# Patient Record
Sex: Female | Born: 1990 | Race: White | Hispanic: No | Marital: Married | Smoking: Former smoker
Health system: Southern US, Community
[De-identification: ages and names within clinical notes are randomized; demographics above are authoritative.]

## PROBLEM LIST (undated history)

## (undated) ENCOUNTER — Inpatient Hospital Stay (HOSPITAL_COMMUNITY): Payer: Self-pay

## (undated) DIAGNOSIS — T8859XA Other complications of anesthesia, initial encounter: Secondary | ICD-10-CM

## (undated) DIAGNOSIS — IMO0002 Reserved for concepts with insufficient information to code with codable children: Secondary | ICD-10-CM

## (undated) DIAGNOSIS — J45909 Unspecified asthma, uncomplicated: Secondary | ICD-10-CM

## (undated) DIAGNOSIS — T4145XA Adverse effect of unspecified anesthetic, initial encounter: Secondary | ICD-10-CM

## (undated) HISTORY — PX: WISDOM TOOTH EXTRACTION: SHX21

## (undated) HISTORY — PX: TONSILLECTOMY: SUR1361

## (undated) HISTORY — PX: CERVICAL BIOPSY: SHX590

---

## 2011-12-29 ENCOUNTER — Emergency Department (HOSPITAL_COMMUNITY)
Admission: EM | Admit: 2011-12-29 | Discharge: 2011-12-29 | Disposition: A | Discharge status: Home / Self Care | Payer: BC Managed Care – PPO | Attending: Emergency Medicine | Admitting: Emergency Medicine

## 2011-12-29 ENCOUNTER — Encounter: Payer: Self-pay | Admitting: *Deleted

## 2011-12-29 DIAGNOSIS — R002 Palpitations: Secondary | ICD-10-CM | POA: Insufficient documentation

## 2011-12-29 DIAGNOSIS — R0789 Other chest pain: Secondary | ICD-10-CM | POA: Insufficient documentation

## 2011-12-29 DIAGNOSIS — R1310 Dysphagia, unspecified: Secondary | ICD-10-CM | POA: Insufficient documentation

## 2011-12-29 DIAGNOSIS — R0602 Shortness of breath: Secondary | ICD-10-CM | POA: Insufficient documentation

## 2011-12-29 DIAGNOSIS — R42 Dizziness and giddiness: Secondary | ICD-10-CM | POA: Insufficient documentation

## 2011-12-29 DIAGNOSIS — R000 Tachycardia, unspecified: Secondary | ICD-10-CM | POA: Insufficient documentation

## 2011-12-29 DIAGNOSIS — T783XXA Angioneurotic edema, initial encounter: Secondary | ICD-10-CM

## 2011-12-29 DIAGNOSIS — Y9229 Other specified public building as the place of occurrence of the external cause: Secondary | ICD-10-CM | POA: Insufficient documentation

## 2011-12-29 DIAGNOSIS — X58XXXA Exposure to other specified factors, initial encounter: Secondary | ICD-10-CM | POA: Insufficient documentation

## 2011-12-29 MED ORDER — EPINEPHRINE 0.3 MG/0.3ML IJ DEVI: 0.3000 mg | Freq: Once | INTRAMUSCULAR | Status: AC

## 2011-12-29 MED ORDER — FAMOTIDINE IN NACL 20-0.9 MG/50ML-% IV SOLN
20.0000 mg | Freq: Once | INTRAVENOUS | Status: AC
  Administered 2011-12-29: 20 mg via INTRAVENOUS
  Filled 2011-12-29: qty 50

## 2011-12-29 MED ORDER — DIPHENHYDRAMINE HCL 50 MG/ML IJ SOLN
INTRAMUSCULAR | Status: AC
  Filled 2011-12-29: qty 1

## 2011-12-29 MED ORDER — FAMOTIDINE 40 MG PO TABS: 20.0000 mg | ORAL_TABLET | Freq: Two times a day (BID) | ORAL | Status: DC

## 2011-12-29 MED ORDER — METHYLPREDNISOLONE SODIUM SUCC 125 MG IJ SOLR
125.0000 mg | Freq: Once | INTRAMUSCULAR | Status: AC
  Administered 2011-12-29: 125 mg via INTRAVENOUS
  Filled 2011-12-29: qty 2

## 2011-12-29 MED ORDER — PREDNISONE 20 MG PO TABS: 60.0000 mg | ORAL_TABLET | Freq: Every day | ORAL | Status: DC

## 2011-12-29 MED ORDER — DIPHENHYDRAMINE HCL 25 MG PO TABS: 25.0000 mg | ORAL_TABLET | Freq: Four times a day (QID) | ORAL | Status: AC

## 2011-12-29 NOTE — ED Notes (Signed)
Per EMS- pt in s/p allergic reaction after eating dinner, swelling to tongue and throat, given 50mg  benadryl (25mg  PO and 25mg  IV), 20g in LFA, swelling reduced, pt states she feels better at this time

## 2011-12-29 NOTE — ED Notes (Signed)
WUJ:[id number]<JY> Expected date:12/29/11<BR> Expected time: 6:37 PM<BR> Means of arrival:Ambulance<BR> Comments:<BR> M110 - 20yoF ?Allergic reaction to shellfish.

## 2011-12-29 NOTE — ED Provider Notes (Addendum)
Medical screening examination/treatment/procedure(s) were performed by non-physician practitioner and as supervising physician I was immediately available for consultation/collaboration.  Per PA all sx resolved at this time. Patient does not wish to stay for extended observation which I would prefer-- she is going home with parents who will follow closely, given precautions by PA for return.   Forbes Cellar, MD 12/29/11 1610  Forbes Cellar, MD 12/29/11 2053

## 2011-12-29 NOTE — ED Provider Notes (Signed)
History     CSN: [id number]  Arrival date & time 12/29/11  1842   First MD Initiated Contact with Patient 12/29/11 1847      Chief Complaint  Patient presents with  . Allergic Reaction    (Consider location/radiation/quality/duration/timing/severity/associated sxs/prior treatment) Patient is a 21 y.o. female presenting with allergic reaction. The history is provided by the patient.  Allergic Reaction The primary symptoms are  shortness of breath, dizziness, palpitations and angioedema. The primary symptoms do not include nausea, vomiting, rash or urticaria. The current episode started less than 1 hour ago. The problem has been resolved.  Dizziness does not occur with nausea or vomiting.  The palpitations also occurred with dizziness and shortness of breath.   The angioedema is associated with shortness of breath.   The onset of the reaction was associated with eating.   Pt states she was a red lobster. States had shrimp for dinner, few minutes later, after finishing her dinner, she developed tightness in her chest, through, states felt like her tongue swelled up, and she had difficulty breathing. Pt was given benadryl by EMS, states her symptoms almost completely resolved. Denies previous allergy to fish. States once ate crab meat and it "made me feel funny."  No rash, no other complaints.    No past medical history on file.  No past surgical history on file.  No family history on file.  History  Substance Use Topics  . Smoking status: Not on file  . Smokeless tobacco: Not on file  . Alcohol Use: Not on file    OB History    Grav Para Term Preterm Abortions TAB SAB Ect Mult Living                  Review of Systems  Constitutional: Negative for fever and chills.  HENT: Positive for trouble swallowing. Negative for facial swelling and neck stiffness.        Throat swelling, tongue swelling  Respiratory: Positive for chest tightness and shortness of breath.     Cardiovascular: Positive for palpitations.  Gastrointestinal: Negative for nausea and vomiting.  Genitourinary: Negative.   Musculoskeletal: Negative.   Skin: Negative for rash.  Neurological: Positive for dizziness.  Psychiatric/Behavioral: Negative.     Allergies  Amoxicillin  Home Medications   Current Outpatient Rx  Name Route Sig Dispense Refill  . LOESTRIN 24 FE PO Oral Take 1 tablet by mouth daily.        BP 121/68  Pulse 107  Temp(Src) 98 F (36.7 C) (Oral)  SpO2 100%  Physical Exam  Nursing note and vitals reviewed. Constitutional: She is oriented to person, place, and time. She appears well-developed and well-nourished. No distress.  HENT:  Head: Normocephalic and atraumatic.  Right Ear: External ear normal.  Left Ear: External ear normal.  Nose: Nose normal.  Mouth/Throat: Oropharynx is clear and moist.       Tongue normal size, uvula non edematous, midline  Eyes: Conjunctivae are normal.  Neck: Neck supple.  Cardiovascular: Regular rhythm and normal heart sounds.  Tachycardia present.   Pulmonary/Chest: Effort normal and breath sounds normal. No respiratory distress.       No wheezes, no stridor  Abdominal: Soft. Bowel sounds are normal. There is no tenderness.  Neurological: She is alert and oriented to person, place, and time.  Skin: Skin is warm and dry. No rash noted.  Psychiatric: She has a normal mood and affect.    ED Course  Procedures (including critical  care time)  Pt with tongue swelling, difficulty breathing after eating shrimp earlier today. By the time pt arrived here, she is symptom free. No stridor, no swelling of tongue, lips, uvula. Tachycardic, otherwise normal VS. Will administer steroids, pepcid, will monitor.   8:28 PM Pt monitored almost 2 hrs in ED. She continues to be symptom free. Pt wanting to go home. Pt will be d/c home with parents. Advised to keep a close eye on pt to watch for 2ndary reaction once medications wear out.  Pt will be d/c home with steroids, benadryl, pepcid and epi pen.    MDM          Lottie Mussel, PA 12/29/11 2029

## 2013-08-11 ENCOUNTER — Inpatient Hospital Stay (HOSPITAL_COMMUNITY): Payer: BC Managed Care – PPO

## 2013-08-11 ENCOUNTER — Encounter (HOSPITAL_COMMUNITY): Payer: Self-pay | Admitting: *Deleted

## 2013-08-11 ENCOUNTER — Inpatient Hospital Stay (HOSPITAL_COMMUNITY)
Admission: AD | Admit: 2013-08-11 | Discharge: 2013-08-11 | Disposition: A | Discharge status: Home / Self Care | Payer: BC Managed Care – PPO | Source: Ambulatory Visit | Attending: Obstetrics & Gynecology | Admitting: Obstetrics & Gynecology

## 2013-08-11 DIAGNOSIS — M549 Dorsalgia, unspecified: Secondary | ICD-10-CM | POA: Insufficient documentation

## 2013-08-11 DIAGNOSIS — O26859 Spotting complicating pregnancy, unspecified trimester: Secondary | ICD-10-CM | POA: Insufficient documentation

## 2013-08-11 DIAGNOSIS — R209 Unspecified disturbances of skin sensation: Secondary | ICD-10-CM | POA: Insufficient documentation

## 2013-08-11 DIAGNOSIS — Z3490 Encounter for supervision of normal pregnancy, unspecified, unspecified trimester: Secondary | ICD-10-CM

## 2013-08-11 DIAGNOSIS — O99891 Other specified diseases and conditions complicating pregnancy: Secondary | ICD-10-CM | POA: Insufficient documentation

## 2013-08-11 DIAGNOSIS — O209 Hemorrhage in early pregnancy, unspecified: Secondary | ICD-10-CM

## 2013-08-11 HISTORY — DX: Other complications of anesthesia, initial encounter: T88.59XA

## 2013-08-11 HISTORY — DX: Reserved for concepts with insufficient information to code with codable children: IMO0002

## 2013-08-11 HISTORY — DX: Adverse effect of unspecified anesthetic, initial encounter: T41.45XA

## 2013-08-11 HISTORY — DX: Unspecified asthma, uncomplicated: J45.909

## 2013-08-11 LAB — ABO/RH: ABO/RH(D): O POS

## 2013-08-11 LAB — CBC WITH DIFFERENTIAL/PLATELET
Basophils Absolute: 0 10*3/uL (ref 0.0–0.1)
Lymphocytes Relative: 20 % (ref 12–46)
Lymphs Abs: 2.1 10*3/uL (ref 0.7–4.0)
Neutro Abs: 7.5 10*3/uL (ref 1.7–7.7)
Neutrophils Relative %: 70 % (ref 43–77)
Platelets: 397 10*3/uL (ref 150–400)
RBC: 4.43 MIL/uL (ref 3.87–5.11)
RDW: 13.2 % (ref 11.5–15.5)
WBC: 10.7 10*3/uL — ABNORMAL HIGH (ref 4.0–10.5)

## 2013-08-11 LAB — URINALYSIS, ROUTINE W REFLEX MICROSCOPIC
Bilirubin Urine: NEGATIVE
Leukocytes, UA: NEGATIVE
Nitrite: NEGATIVE
Specific Gravity, Urine: 1.02 (ref 1.005–1.030)
pH: 7 (ref 5.0–8.0)

## 2013-08-11 LAB — WET PREP, GENITAL
Clue Cells Wet Prep HPF POC: NONE SEEN
Trich, Wet Prep: NONE SEEN

## 2013-08-11 LAB — HCG, QUANTITATIVE, PREGNANCY: hCG, Beta Chain, Quant, S: 13952 m[IU]/mL — ABNORMAL HIGH (ref <5)

## 2013-08-11 LAB — POCT PREGNANCY, URINE: Preg Test, Ur: POSITIVE — AB

## 2013-08-11 NOTE — MAU Note (Signed)
Patient states she has had a positive home pregnancy test and at an Urgent Care in [place]. States she has had pins and needles feeling in the back of both lower legs, worse in the left. Has had mid to low back pain but not at the same time as the leg pain. States she has had pink spotting on and off for about one with with some abdominal cramping. Has nausea, no vomiting.

## 2013-08-11 NOTE — MAU Provider Note (Signed)
Attestation of Attending Supervision of Advanced Practitioner (CNM/NP): Evaluation and management procedures were performed by the Advanced Practitioner under my supervision and collaboration.  I have reviewed the Advanced Practitioner's note and chart, and I agree with the management and plan.  HARRAWAY-SMITH, Azaria Bartell 7:31 PM

## 2013-08-11 NOTE — MAU Provider Note (Signed)
History     CSN: [id number]  Arrival date and time: 08/11/13 1357   First Provider Initiated Contact with Patient 08/11/13 1501    Chief complaint  "I've had some spotting, and my back has been hurting."   HPI  Lindsay Rollins is a 22 y.o. G2P0010 who presents today due to back pain, changes in neurological sensation in her legs, and vaginal bleeding. She reports being roughly six weeks pregnant. The pregnancy was unplanned.  She has a history of irregular menstrual periods.  Her primary concern today is the baby's health.  Her vaginal bleeding is described as spotting that occurs without insult.  She reports the amount of blood as scant, and is seen only during after using the bathroom.  The spotting has occurred three times, once last week, yesterday and today.  She denies abdominal pain.  She does report some frequency and urgency with urination, but denies dysuria. She is defecating normally.    Her back pain began less than three weeks ago.  She describes it as a dull ache, bilateral, and has pain with bending over. She reports feeling tingling bilaterally in her feet periodically.  She denies back injury or trauma.  She is in a 1 year monogamous relationship with the father of the fetus.  She denies vaginal itching, discomfort, or pain.           Past Medical History  Diagnosis Date  . Asthma   . Ulcer   . Abnormal Pap smear   . Complication of anesthesia     Past Surgical History  Procedure Laterality Date  . Cervical biopsy    . Tonsillectomy      No family history on file.  History  Substance Use Topics  . Smoking status: Former Smoker    Types: Cigarettes    Quit date: 07/17/2013  . Smokeless tobacco: Not on file  . Alcohol Use: No    Allergies:  Allergies  Allergen Reactions  . Shellfish Allergy Anaphylaxis  . Amoxicillin Hives  . Penicillins Hives  . Clindamycin/Lincomycin Palpitations    Prescriptions prior to admission  Medication Sig Dispense  Refill  . albuterol (PROVENTIL HFA;VENTOLIN HFA) 108 (90 BASE) MCG/ACT inhaler Inhale 2 puffs into the lungs every 6 (six) hours as needed for wheezing or shortness of breath.      . EPINEPHrine (EPI-PEN) 0.3 mg/0.3 mL DEVI Inject 0.3 mLs (0.3 mg total) into the muscle once.  2 Device  0    Review of Systems  Constitutional: Negative for fever, chills and weight loss.  HENT: Negative for hearing loss.   Eyes: Negative for blurred vision and double vision.  Respiratory: Negative for cough, shortness of breath and wheezing.   Cardiovascular: Negative for chest pain and palpitations.  Gastrointestinal: Negative for heartburn, nausea and vomiting.  Genitourinary: Positive for urgency and frequency. Negative for dysuria.  Musculoskeletal: Positive for back pain.  Skin: Negative for itching and rash.  Neurological: Positive for tingling. Negative for dizziness, tremors, speech change, focal weakness, loss of consciousness and headaches.   Physical Exam   Blood pressure 123/61, pulse 100, temperature 98.1 F (36.7 C), resp. rate 16, height 4' 10.5" (1.486 m), weight 100 lb 12.8 oz (45.723 kg), last menstrual period 06/26/2013, SpO2 100.00%.  Physical Exam  Constitutional: She is oriented to person, place, and time. She appears well-developed and well-nourished. No distress.  HENT:  Mouth/Throat: No oropharyngeal exudate.  Eyes: Right eye exhibits no discharge. Left eye exhibits no discharge. No  scleral icterus.  Neck: No tracheal deviation present.  GI: Soft. Bowel sounds are normal. She exhibits no mass. There is tenderness ( In the LLQ). There is no rebound and no guarding.  Genitourinary: There is no rash on the right labia. There is no rash on the left labia. Discharge:  There was a small amount of white discharge consistent with cervical mucous.   Right adnexum displays no mass. Left adnexum displays no mass. No erythema, tenderness or bleeding around the vagina. No vaginal discharge  found.  Neurological: She is alert and oriented to person, place, and time. She has normal reflexes.  Skin: Skin is warm and dry. She is not diaphoretic.  Psychiatric: She has a normal mood and affect. Her behavior is normal. Judgment and thought content normal.   MAU Course  Procedures  GC/CHL culture pending  Results for orders placed during the hospital encounter of 08/11/13 (from the past 24 hour(s))  URINALYSIS, ROUTINE W REFLEX MICROSCOPIC     Status: None   Collection Time    08/11/13  2:25 PM      Result Value Range   Color, Urine YELLOW  YELLOW   APPearance CLEAR  CLEAR   Specific Gravity, Urine 1.020  1.005 - 1.030   pH 7.0  5.0 - 8.0   Glucose, UA NEGATIVE  NEGATIVE mg/dL   Hgb urine dipstick NEGATIVE  NEGATIVE   Bilirubin Urine NEGATIVE  NEGATIVE   Ketones, ur NEGATIVE  NEGATIVE mg/dL   Protein, ur NEGATIVE  NEGATIVE mg/dL   Urobilinogen, UA 0.2  0.0 - 1.0 mg/dL   Nitrite NEGATIVE  NEGATIVE   Leukocytes, UA NEGATIVE  NEGATIVE  POCT PREGNANCY, URINE     Status: Abnormal   Collection Time    08/11/13  2:42 PM      Result Value Range   Preg Test, Ur POSITIVE (*) NEGATIVE  CBC WITH DIFFERENTIAL     Status: Abnormal   Collection Time    08/11/13  3:10 PM      Result Value Range   WBC 10.7 (*) 4.0 - 10.5 K/uL   RBC 4.43  3.87 - 5.11 MIL/uL   Hemoglobin 13.3  12.0 - 15.0 g/dL   HCT 16.1  09.6 - 04.5 %   MCV 83.7  78.0 - 100.0 fL   MCH 30.0  26.0 - 34.0 pg   MCHC 35.8  30.0 - 36.0 g/dL   RDW 40.9  81.1 - 91.4 %   Platelets 397  150 - 400 K/uL   Neutrophils Relative % 70  43 - 77 %   Neutro Abs 7.5  1.7 - 7.7 K/uL   Lymphocytes Relative 20  12 - 46 %   Lymphs Abs 2.1  0.7 - 4.0 K/uL   Monocytes Relative 9  3 - 12 %   Monocytes Absolute 1.0  0.1 - 1.0 K/uL   Eosinophils Relative 1  0 - 5 %   Eosinophils Absolute 0.1  0.0 - 0.7 K/uL   Basophils Relative 0  0 - 1 %   Basophils Absolute 0.0  0.0 - 0.1 K/uL  ABO/RH     Status: None   Collection Time    08/11/13   3:10 PM      Result Value Range   ABO/RH(D) O POS    HCG, QUANTITATIVE, PREGNANCY     Status: Abnormal   Collection Time    08/11/13  3:10 PM      Result Value Range   hCG,  Beta Chain, Sharene Butters, [place] [address] (*) <5 mIU/mL  WET PREP, GENITAL     Status: None   Collection Time    08/11/13  4:10 PM      Result Value Range   Yeast Wet Prep HPF POC NONE SEEN  NONE SEEN   Trich, Wet Prep NONE SEEN  NONE SEEN   Clue Cells Wet Prep HPF POC NONE SEEN  NONE SEEN   WBC, Wet Prep HPF POC NONE SEEN  NONE SEEN      Assessment and Plan  A:  Spotting in early pregnancy      Viable IUP 5 days earlier than by LMP      Back pain with intermittent tingling in feet  P:  May use tylenol for discomfort      Pelvic rest until spotting resolves      Return for increased bleeding or increased pain     Return for worsening sxs   Nyajah Hyson,EVE M 08/11/2013, 5:28 PM

## 2013-09-02 ENCOUNTER — Other Ambulatory Visit: Payer: Self-pay

## 2013-09-08 ENCOUNTER — Encounter (HOSPITAL_COMMUNITY): Payer: Self-pay | Admitting: *Deleted

## 2013-09-08 ENCOUNTER — Inpatient Hospital Stay (HOSPITAL_COMMUNITY)
Admission: AD | Admit: 2013-09-08 | Discharge: 2013-09-08 | Disposition: A | Discharge status: Home / Self Care | Payer: Managed Care, Other (non HMO) | Source: Ambulatory Visit | Attending: Obstetrics and Gynecology | Admitting: Obstetrics and Gynecology

## 2013-09-08 DIAGNOSIS — R0602 Shortness of breath: Secondary | ICD-10-CM | POA: Insufficient documentation

## 2013-09-08 DIAGNOSIS — O99891 Other specified diseases and conditions complicating pregnancy: Secondary | ICD-10-CM | POA: Insufficient documentation

## 2013-09-08 DIAGNOSIS — R12 Heartburn: Secondary | ICD-10-CM | POA: Insufficient documentation

## 2013-09-08 DIAGNOSIS — F411 Generalized anxiety disorder: Secondary | ICD-10-CM | POA: Insufficient documentation

## 2013-09-08 DIAGNOSIS — F329 Major depressive disorder, single episode, unspecified: Secondary | ICD-10-CM

## 2013-09-08 MED ORDER — RANITIDINE HCL 150 MG PO TABS: 150.0000 mg | ORAL_TABLET | Freq: Every day | ORAL | Status: DC

## 2013-09-08 MED ORDER — SERTRALINE HCL 50 MG PO TABS: 50.0000 mg | ORAL_TABLET | Freq: Every day | ORAL | Status: DC

## 2013-09-08 NOTE — Progress Notes (Signed)
yesterday 

## 2013-09-08 NOTE — MAU Note (Signed)
Pt complains on tightness in her chest and palpitations. She describes the feeling as "something sitting on my chest". She has felt this way for two weeks, and the feeling has gotten worse this weak.

## 2013-09-08 NOTE — MAU Provider Note (Signed)
History     CSN: [id number]  Arrival date and time: 09/08/13 1555   First Provider Initiated Contact with Patient 09/08/13 1657      Chief Complaint  Patient presents with  . Shortness of Breath   HPI  Lindsay Rollins is a 22 y.o. female; G3P0010 at [redacted]w[redacted]d who presents with complaints of intermittent shortness of breath; she was seen at her Dr. Isidore Moos today and was told to come here for evaluation. She does not currently have shortness of breath; when it comes it just depends on what she's doing that factors into the symptoms. She was presribed Lexapro for anxiety in the past, however she never filled the prescription.  She has a history of asthma; last time she had to use her inhaler was a "few months ago".  She is accompanied by her boyfriend and his mother who have voiced their concerns regarding her symptoms being related to anxiety. This was an unexpected pregnancy, and the patient feels she has a lot on her plate.  Pt denies the thought of harm to herself or anyone else.   OB History   Grav Para Term Preterm Abortions TAB SAB Ect Mult Living   3 0   1  1   0      Past Medical History  Diagnosis Date  . Asthma   . Ulcer   . Abnormal Pap smear   . Complication of anesthesia     Past Surgical History  Procedure Laterality Date  . Cervical biopsy    . Tonsillectomy      Family History  Problem Relation Age of Onset  . Hyperthyroidism Mother   . Diabetes Father     History  Substance Use Topics  . Smoking status: Former Smoker    Types: Cigarettes    Quit date: 07/17/2013  . Smokeless tobacco: Not on file  . Alcohol Use: No    Allergies:  Allergies  Allergen Reactions  . Shellfish Allergy Anaphylaxis  . Amoxicillin Hives  . Penicillins Hives  . Clindamycin/Lincomycin Palpitations    Prescriptions prior to admission  Medication Sig Dispense Refill  . albuterol (PROVENTIL HFA;VENTOLIN HFA) 108 (90 BASE) MCG/ACT inhaler Inhale 2 puffs into the lungs  every 6 (six) hours as needed for wheezing or shortness of breath.      . EPINEPHrine (EPI-PEN) 0.3 mg/0.3 mL DEVI Inject 0.3 mLs (0.3 mg total) into the muscle once.  2 Device  0  . Prenatal Vit-Fe Fumarate-FA (PRENATAL MULTIVITAMIN) TABS tablet Take 1 tablet by mouth daily at 12 noon.        Review of Systems  Constitutional: Negative for fever, chills and malaise/fatigue.  Respiratory: Negative for cough, shortness of breath and wheezing.   Cardiovascular: Positive for palpitations. Negative for chest pain.       Negative chest pain currently; symptoms more common at night  Gastrointestinal: Negative for nausea, vomiting and abdominal pain.  Genitourinary: Negative for dysuria, urgency and frequency.       No vaginal discharge. No vaginal bleeding. No dysuria.   Neurological: Negative for dizziness, loss of consciousness and headaches.  Psychiatric/Behavioral: Positive for depression. The patient is nervous/anxious.    Physical Exam   Blood pressure 111/69, pulse 106, temperature 98.1 F (36.7 C), temperature source Oral, resp. rate 16, last menstrual period 06/26/2013, SpO2 100.00%. Fetal Heart tones by Doppler 177 bpm    Physical Exam  Constitutional: She is oriented to person, place, and time. She appears well-developed and well-nourished.  No distress.  HENT:  Head: Normocephalic.  Neck: Neck supple.  Cardiovascular: Normal rate, regular rhythm, S1 normal, S2 normal and normal heart sounds.   Respiratory: Effort normal and breath sounds normal. No respiratory distress.  Neurological: She is alert and oriented to person, place, and time.  Skin: Skin is warm and dry. She is not diaphoretic.  Psychiatric: She has a normal mood and affect. Her behavior is normal.    MAU Course  Procedures  MDM +fht   EKG- Normal reading/ Normal sinus rhythm   Consulted with Dr. Gerlene Burdock regarding plan of care and EKG reading; Ok to presribe pt zoloft and zantac Assessment and Plan   A: Heart burn in Pregnancy Anxiety   P: Discharge home RX: Zantac 150 mg PO daily at bedtime (#30) with 1 rf Zoloft 50 mg PO daily (#30) 1 rf Return to MAU with worsening symptoms Discussed anxiety attack symptoms and warning signs  Ok to use over the counter tums or maalox for heart burn Keep your regular scheduled appointment with [facility] [place]; call if need to be seen earlier Support given   Venia Carbon IRENE FNP-C 09/08/2013, 7:14 PM

## 2013-09-08 NOTE — Progress Notes (Signed)
Pt given Loproxil but did not take it.

## 2013-10-24 ENCOUNTER — Encounter (HOSPITAL_COMMUNITY): Payer: Self-pay | Admitting: *Deleted

## 2013-10-24 ENCOUNTER — Inpatient Hospital Stay (HOSPITAL_COMMUNITY)
Admission: AD | Admit: 2013-10-24 | Discharge: 2013-10-24 | Disposition: A | Discharge status: Home / Self Care | Payer: Managed Care, Other (non HMO) | Source: Ambulatory Visit | Attending: Obstetrics & Gynecology | Admitting: Obstetrics & Gynecology

## 2013-10-24 DIAGNOSIS — O2390 Unspecified genitourinary tract infection in pregnancy, unspecified trimester: Secondary | ICD-10-CM | POA: Insufficient documentation

## 2013-10-24 DIAGNOSIS — O4692 Antepartum hemorrhage, unspecified, second trimester: Secondary | ICD-10-CM

## 2013-10-24 DIAGNOSIS — B373 Candidiasis of vulva and vagina: Secondary | ICD-10-CM

## 2013-10-24 DIAGNOSIS — B3731 Acute candidiasis of vulva and vagina: Secondary | ICD-10-CM | POA: Insufficient documentation

## 2013-10-24 DIAGNOSIS — R1032 Left lower quadrant pain: Secondary | ICD-10-CM | POA: Insufficient documentation

## 2013-10-24 DIAGNOSIS — O26899 Other specified pregnancy related conditions, unspecified trimester: Secondary | ICD-10-CM

## 2013-10-24 LAB — WET PREP, GENITAL
Clue Cells Wet Prep HPF POC: NONE SEEN
Trich, Wet Prep: NONE SEEN

## 2013-10-24 LAB — URINALYSIS, ROUTINE W REFLEX MICROSCOPIC
Bilirubin Urine: NEGATIVE
Glucose, UA: NEGATIVE mg/dL
Ketones, ur: NEGATIVE mg/dL
Leukocytes, UA: NEGATIVE
Nitrite: NEGATIVE
Specific Gravity, Urine: 1.01 (ref 1.005–1.030)
pH: 6 (ref 5.0–8.0)

## 2013-10-24 MED ORDER — FLUCONAZOLE 150 MG PO TABS
150.0000 mg | ORAL_TABLET | Freq: Once | ORAL | Status: AC
  Administered 2013-10-24: 150 mg via ORAL
  Filled 2013-10-24: qty 1

## 2013-10-24 NOTE — MAU Note (Signed)
Pt G3 P0 at 17.1wks with cramping and bright red bleeding. No clots, no problems with pregnancy.

## 2013-10-24 NOTE — MAU Provider Note (Signed)
History     CSN: [id number]  Arrival date and time: 10/24/13 4098   First Provider Initiated Contact with Patient 10/24/13 2108      Chief Complaint  Patient presents with  . Abdominal Cramping  . Vaginal Bleeding   HPI [name] is a 22 y.o. G2P0010 at [redacted]w[redacted]d who presents to MAU today with complaint of LLQ pain and vaginal bleeding. The patient states that she sees [facility] in [place] for prenatal care. She was in the office today and denies a pelvic exam at that time. She states that she was given Metrogel for BV because of a recent diagnosis. She states that she has had a light pink spotting throughout her pregnancy, but this afternoon noted some heavier bleeding. She did not soak a pad and the bleeding has lightened significantly since then. The patient states that she has had a yellow discharge recently. She states last intercourse was 4 days ago. She states pain is worse with change in position. She denies pain at this time and no fever.   OB History   Grav Para Term Preterm Abortions TAB SAB Ect Mult Living   2 0   1  1   0      Past Medical History  Diagnosis Date  . Asthma   . Ulcer   . Abnormal Pap smear   . Complication of anesthesia     Past Surgical History  Procedure Laterality Date  . Cervical biopsy    . Tonsillectomy      Family History  Problem Relation Age of Onset  . Hyperthyroidism Mother   . Diabetes Father     History  Substance Use Topics  . Smoking status: Former Smoker    Types: Cigarettes    Quit date: 07/17/2013  . Smokeless tobacco: Not on file  . Alcohol Use: No    Allergies:  Allergies  Allergen Reactions  . Shellfish Allergy Anaphylaxis  . Amoxicillin Hives  . Cefdinir Hives    Patient develops hives in the mouth.  . Penicillins Hives  . Clindamycin/Lincomycin Palpitations    Prescriptions prior to admission  Medication Sig Dispense Refill  . Prenatal Vit-Fe Fumarate-FA (PRENATAL MULTIVITAMIN) TABS tablet Take 1  tablet by mouth daily at 12 noon.      Marland Kitchen albuterol (PROVENTIL HFA;VENTOLIN HFA) 108 (90 BASE) MCG/ACT inhaler Inhale 2 puffs into the lungs every 6 (six) hours as needed for wheezing or shortness of breath.      . EPINEPHrine (EPI-PEN) 0.3 mg/0.3 mL DEVI Inject 0.3 mLs (0.3 mg total) into the muscle once.  2 Device  0    Review of Systems  Constitutional: Negative for fever and malaise/fatigue.  Gastrointestinal: Positive for nausea, abdominal pain and constipation. Negative for vomiting and diarrhea.  Genitourinary: Negative for dysuria, urgency and frequency.       + vaginal discharge, bleeding   Physical Exam   Blood pressure 124/78, pulse 106, temperature 98.1 F (36.7 C), temperature source Oral, resp. rate 16, height 4\' 11"  (1.499 m), weight 106 lb 9.6 oz (48.353 kg), last menstrual period 06/26/2013.  Physical Exam  Constitutional: She is oriented to person, place, and time. She appears well-developed and well-nourished. No distress.  HENT:  Head: Normocephalic and atraumatic.  Cardiovascular: Normal rate, regular rhythm and normal heart sounds.   Respiratory: Effort normal and breath sounds normal. No respiratory distress.  GI: Soft. Bowel sounds are normal. She exhibits no distension and no mass. There is tenderness (mild tenderness  to palpation of the LLQ and at midline). There is no rebound and no guarding.  Genitourinary: Uterus is enlarged (appropriate for GA). Uterus is not tender. Cervix exhibits friability. Cervix exhibits no motion tenderness and no discharge. No bleeding around the vagina. Vaginal discharge (small amount of thin, white discharge noted with some clumps in the vagina) found.  Neurological: She is alert and oriented to person, place, and time.  Skin: Skin is warm and dry. No erythema.  Psychiatric: She has a normal mood and affect.   Results for orders placed during the hospital encounter of 10/24/13 (from the past 24 hour(s))  URINALYSIS, ROUTINE W REFLEX  MICROSCOPIC     Status: None   Collection Time    10/24/13  8:27 PM      Result Value Range   Color, Urine YELLOW  YELLOW   APPearance CLEAR  CLEAR   Specific Gravity, Urine 1.010  1.005 - 1.030   pH 6.0  5.0 - 8.0   Glucose, UA NEGATIVE  NEGATIVE mg/dL   Hgb urine dipstick NEGATIVE  NEGATIVE   Bilirubin Urine NEGATIVE  NEGATIVE   Ketones, ur NEGATIVE  NEGATIVE mg/dL   Protein, ur NEGATIVE  NEGATIVE mg/dL   Urobilinogen, UA 0.2  0.0 - 1.0 mg/dL   Nitrite NEGATIVE  NEGATIVE   Leukocytes, UA NEGATIVE  NEGATIVE  WET PREP, GENITAL     Status: Abnormal   Collection Time    10/24/13  9:10 PM      Result Value Range   Yeast Wet Prep HPF POC NONE SEEN  NONE SEEN   Trich, Wet Prep NONE SEEN  NONE SEEN   Clue Cells Wet Prep HPF POC NONE SEEN  NONE SEEN   WBC, Wet Prep HPF POC FEW (*) NONE SEEN    MAU Course  Procedures None  MDM FHR - 166 bpm with doppler UA, Wet prep and GC/Chlamydia today  Assessment and Plan  A: Yeast vulvovaginitis, clinical Round ligament pain  P: Discharge home Patient treated with Diflucan in MAU Patient advised to follow-up with [facility] provider as scheduled or sooner if symptoms persist or worsen Patient may return to MAU as needed or if her condition were to change or worsen  Freddi Starr, PA-C  10/24/2013, 10:00 PM

## 2013-12-21 ENCOUNTER — Encounter (HOSPITAL_COMMUNITY): Payer: Self-pay | Admitting: Emergency Medicine

## 2013-12-21 ENCOUNTER — Emergency Department (HOSPITAL_COMMUNITY): Payer: Medicaid Other

## 2013-12-21 ENCOUNTER — Emergency Department (HOSPITAL_COMMUNITY)
Admission: EM | Admit: 2013-12-21 | Discharge: 2013-12-21 | Disposition: A | Discharge status: Home / Self Care | Payer: Medicaid Other | Attending: Emergency Medicine | Admitting: Emergency Medicine

## 2013-12-21 DIAGNOSIS — R109 Unspecified abdominal pain: Secondary | ICD-10-CM

## 2013-12-21 DIAGNOSIS — J45909 Unspecified asthma, uncomplicated: Secondary | ICD-10-CM | POA: Insufficient documentation

## 2013-12-21 DIAGNOSIS — Z880 Allergy status to penicillin: Secondary | ICD-10-CM | POA: Insufficient documentation

## 2013-12-21 DIAGNOSIS — R110 Nausea: Secondary | ICD-10-CM

## 2013-12-21 DIAGNOSIS — M549 Dorsalgia, unspecified: Secondary | ICD-10-CM | POA: Insufficient documentation

## 2013-12-21 DIAGNOSIS — O9989 Other specified diseases and conditions complicating pregnancy, childbirth and the puerperium: Secondary | ICD-10-CM | POA: Insufficient documentation

## 2013-12-21 DIAGNOSIS — J029 Acute pharyngitis, unspecified: Secondary | ICD-10-CM | POA: Insufficient documentation

## 2013-12-21 DIAGNOSIS — Z872 Personal history of diseases of the skin and subcutaneous tissue: Secondary | ICD-10-CM | POA: Insufficient documentation

## 2013-12-21 DIAGNOSIS — Z87891 Personal history of nicotine dependence: Secondary | ICD-10-CM | POA: Insufficient documentation

## 2013-12-21 DIAGNOSIS — Z79899 Other long term (current) drug therapy: Secondary | ICD-10-CM | POA: Insufficient documentation

## 2013-12-21 LAB — URINALYSIS, ROUTINE W REFLEX MICROSCOPIC
Glucose, UA: NEGATIVE mg/dL
Ketones, ur: NEGATIVE mg/dL
Leukocytes, UA: NEGATIVE
Protein, ur: NEGATIVE mg/dL
Specific Gravity, Urine: 1.004 — ABNORMAL LOW (ref 1.005–1.030)
Urobilinogen, UA: 0.2 mg/dL (ref 0.0–1.0)

## 2013-12-21 LAB — COMPREHENSIVE METABOLIC PANEL
ALT: 11 U/L (ref 0–35)
AST: 16 U/L (ref 0–37)
Alkaline Phosphatase: 86 U/L (ref 39–117)
CO2: 21 mEq/L (ref 19–32)
Calcium: 8.9 mg/dL (ref 8.4–10.5)
Chloride: 101 mEq/L (ref 96–112)
GFR calc non Af Amer: >90 mL/min (ref >90)
Potassium: 3.2 mEq/L — ABNORMAL LOW (ref 3.7–5.3)
Sodium: 135 mEq/L — ABNORMAL LOW (ref 137–147)
Total Bilirubin: 0.3 mg/dL (ref 0.3–1.2)
Total Protein: 6.3 g/dL (ref 6.0–8.3)

## 2013-12-21 LAB — CBC WITH DIFFERENTIAL/PLATELET
Basophils Absolute: 0 10*3/uL (ref 0.0–0.1)
Eosinophils Absolute: 0.1 10*3/uL (ref 0.0–0.7)
HCT: 30.3 % — ABNORMAL LOW (ref 36.0–46.0)
Lymphocytes Relative: 11 % — ABNORMAL LOW (ref 12–46)
Monocytes Relative: 6 % (ref 3–12)
Neutro Abs: 12.7 10*3/uL — ABNORMAL HIGH (ref 1.7–7.7)
Neutrophils Relative %: 83 % — ABNORMAL HIGH (ref 43–77)
Platelets: 301 10*3/uL (ref 150–400)
RBC: 3.51 MIL/uL — ABNORMAL LOW (ref 3.87–5.11)
RDW: 13.5 % (ref 11.5–15.5)
WBC: 15.4 10*3/uL — ABNORMAL HIGH (ref 4.0–10.5)

## 2013-12-21 LAB — LIPASE, BLOOD: Lipase: 36 U/L (ref 11–59)

## 2013-12-21 MED ORDER — ONDANSETRON 4 MG PO TBDP: ORAL_TABLET | ORAL | Status: AC

## 2013-12-21 MED ORDER — HYDROCODONE-ACETAMINOPHEN 5-325 MG PO TABS
1.0000 | ORAL_TABLET | Freq: Once | ORAL | Status: AC
  Administered 2013-12-21: 1 via ORAL
  Filled 2013-12-21: qty 1

## 2013-12-21 MED ORDER — ACETAMINOPHEN 325 MG PO TABS
650.0000 mg | ORAL_TABLET | Freq: Once | ORAL | Status: AC
  Administered 2013-12-21: 650 mg via ORAL
  Filled 2013-12-21: qty 2

## 2013-12-21 MED ORDER — SODIUM CHLORIDE 0.9 % IV BOLUS (SEPSIS)
1000.0000 mL | Freq: Once | INTRAVENOUS | Status: AC
  Administered 2013-12-21: 1000 mL via INTRAVENOUS

## 2013-12-21 MED ORDER — ONDANSETRON 4 MG PO TBDP: ORAL_TABLET | ORAL | Status: DC

## 2013-12-21 MED ORDER — HYDROCODONE-ACETAMINOPHEN 5-325 MG PO TABS: 2.0000 | ORAL_TABLET | ORAL | Status: DC | PRN

## 2013-12-21 MED ORDER — HYDROCODONE-ACETAMINOPHEN 5-325 MG PO TABS: 1.0000 | ORAL_TABLET | ORAL | Status: AC | PRN

## 2013-12-21 NOTE — ED Notes (Signed)
Pt states that she started having R sided flank pain x 2 days. 6.5 months pregnant. EDD April 13th 2015. Denies bleeding, or urinary sx. First pregnancy.

## 2013-12-21 NOTE — Progress Notes (Signed)
EFM having to run paper strip due to surveillance monitor not connecting.

## 2013-12-21 NOTE — Progress Notes (Signed)
Called Dr. Penne Lash to notify her of pt and pt c/o. Notified of EFM with a category I tracing with occasional UI. Dr. Penne Lash was concerned about pyelonephrosis. If pt does have pyelonephrosis pt should be admitted to [facility]. At this time Dr. Penne Lash felt the pt no longer needed EFM or any other OB evaluation.

## 2013-12-21 NOTE — Progress Notes (Signed)
[facility] ED called regarding pt with right flank pain and 6 months pregnant. OB RR RN in route.

## 2013-12-21 NOTE — ED Provider Notes (Signed)
CSN: [id number]     Arrival date & time 12/21/13  1615 History   None    Chief Complaint  Patient presents with  . Flank Pain  . 6 months pregnant    (Consider location/radiation/quality/duration/timing/severity/associated sxs/prior Treatment) Patient is a 22 y.o. female presenting with flank pain.  Flank Pain Associated symptoms include abdominal pain and a sore throat. Pertinent negatives include no chest pain, chills, congestion, coughing, fever or neck pain.   22 yo female 6.5  Months pregnant presents with 2 day hx of RIGHT flank pain that is intermittent and currently rated at 9/10. Pain is not associated with eating. Patient does admit some relief with bowel movement though pain returns afterwards. Patient taken tylenol with little relief. Patient admits to a hx of kidney stones when she was 16. States that this episode presents similarly though pain is worse this time than before. Patient denies any issues with current pregnancy. Denies any new sexual contacts, and is monogamous with same partner x 1.5 yrs. Admits to Nausea and some constipation. Denies Vomiting, Diarrhea, hematuria, vaginal bleeding, vaginal pain.  No hx of abdominal surgery. PMH significant for abnormal pap smear, and Ulcer.  Past Medical History  Diagnosis Date  . Asthma   . Ulcer   . Abnormal Pap smear   . Complication of anesthesia    Past Surgical History  Procedure Laterality Date  . Cervical biopsy    . Tonsillectomy     Family History  Problem Relation Age of Onset  . Hyperthyroidism Mother   . Diabetes Father    History  Substance Use Topics  . Smoking status: Former Smoker    Types: Cigarettes    Quit date: 07/17/2013  . Smokeless tobacco: Not on file  . Alcohol Use: No   OB History   Grav Para Term Preterm Abortions TAB SAB Ect Mult Living   2 0   1  1   0     Review of Systems  Constitutional: Negative for fever and chills.  HENT: Positive for sore throat. Negative for congestion,  ear pain, sinus pressure and trouble swallowing.   Respiratory: Negative for cough and shortness of breath.   Cardiovascular: Negative for chest pain.  Gastrointestinal: Positive for abdominal pain. Negative for blood in stool.  Genitourinary: Positive for flank pain. Negative for dysuria, urgency, frequency and difficulty urinating.  Musculoskeletal: Positive for back pain. Negative for neck pain and neck stiffness.    Allergies  Shellfish allergy; Amoxicillin; Cefdinir; Penicillins; and Clindamycin/lincomycin  Home Medications   Current Outpatient Rx  Name  Route  Sig  Dispense  Refill  . acetaminophen (TYLENOL) 325 MG tablet   Oral   Take 650 mg by mouth every 6 (six) hours as needed (pain).         . Prenatal Vit-Fe Fumarate-FA (PRENATAL MULTIVITAMIN) TABS tablet   Oral   Take 1 tablet by mouth daily at 12 noon.         Marland Kitchen albuterol (PROVENTIL HFA;VENTOLIN HFA) 108 (90 BASE) MCG/ACT inhaler   Inhalation   Inhale 2 puffs into the lungs every 6 (six) hours as needed for wheezing or shortness of breath.         . EPINEPHrine (EPI-PEN) 0.3 mg/0.3 mL DEVI   Intramuscular   Inject 0.3 mLs (0.3 mg total) into the muscle once.   2 Device   0   . HYDROcodone-acetaminophen (NORCO/VICODIN) 5-325 MG per tablet   Oral   Take 1 tablet by mouth  every 4 (four) hours as needed.   12 tablet   0   . ondansetron (ZOFRAN ODT) 4 MG disintegrating tablet      4mg  ODT q4 hours prn nausea/vomit   8 tablet   0    BP 98/75  Pulse 100  Temp(Src) 98 F (36.7 C) (Oral)  Resp 18  SpO2 98%  LMP 06/26/2013 Physical Exam  Nursing note and vitals reviewed. Constitutional: She is oriented to person, place, and time. She appears well-developed and well-nourished. No distress.  HENT:  Head: Normocephalic and atraumatic.  Cardiovascular: Normal rate and regular rhythm.  Exam reveals no gallop and no friction rub.   No murmur heard. Pulmonary/Chest: Effort normal and breath sounds  normal. No respiratory distress. She has no wheezes. She has no rales.  Abdominal: Soft. Bowel sounds are decreased. There is no hepatosplenomegaly. There is no tenderness. There is CVA tenderness (RIGHT). There is no rigidity, no rebound, no guarding, no tenderness at McBurney's point and negative Murphy's sign.  Patient abdomen shows no scars. Visually consistent with 6.5 month pregnancy. Patient has RIGHT flank pain with palpation. No suprapubic pain   Musculoskeletal: Normal range of motion. She exhibits no edema.  Neurological: She is alert and oriented to person, place, and time.  Skin: Skin is warm and dry. She is not diaphoretic.  Psychiatric: She has a normal mood and affect. Her behavior is normal.    ED Course  Procedures (including critical care time) Labs Review Labs Reviewed  URINALYSIS, ROUTINE W REFLEX MICROSCOPIC - Abnormal; Notable for the following:    Specific Gravity, Urine 1.004 (*)    All other components within normal limits  CBC WITH DIFFERENTIAL - Abnormal; Notable for the following:    WBC 15.4 (*)    RBC 3.51 (*)    Hemoglobin 10.7 (*)    HCT 30.3 (*)    Neutrophils Relative % 83 (*)    Neutro Abs 12.7 (*)    Lymphocytes Relative 11 (*)    All other components within normal limits  COMPREHENSIVE METABOLIC PANEL - Abnormal; Notable for the following:    Sodium 135 (*)    Potassium 3.2 (*)    BUN 4 (*)    Creatinine, Ser 0.45 (*)    Albumin 3.1 (*)    All other components within normal limits  URINE CULTURE  LIPASE, BLOOD   Imaging Review US Renal  12/21/2013   CLINICAL DATA:  Right flank pain.  [redacted] weeks pregnant.  EXAM: RENAL/URINARY TRACT ULTRASOUND COMPLETE  COMPARISON:  None.  FINDINGS: Right Kidney:  Length: 11.7 cm. Echogenicity within normal limits. No renal mass identified. Mild hydronephrosis demonstrated.  Left Kidney:  Length: 10.9 cm. Echogenicity within normal limits. No renal mass identified. Mild hydronephrosis demonstrated.  Bladder:   Appears normal for degree of bladder distention. Intrauterine fetus noted.  IMPRESSION: Mild symmetric bilateral hydronephrosis.   Electronically Signed   By: Myles Rosenthal M.D.   On: 12/21/2013 18:59    EKG Interpretation   None       MDM   1. Right flank pain   2. Nausea alone     Leukocytosis to 15.4. Anemia noted. Patient clinically dehydrated with mild hypokalemia and borderline hyponatremia. Will start IV fluids. Renal US pending.   US shows Mild Bilateral hydronephrosis. UA negative. No microscopic blood in urine, patient reports no hematuria. Last kidney stone was reported at age 60 yo.  Doubt kidney stone. Doubt UTI, Doubt Pyelonephrosis.   Discussed labs,  and exam findings with patient. Recommend return to ED should symptoms worsen, vaginal bleeding should occur, increase in abdominal pain, or develop fever/chills that do not respond to OTC tylenol. Patient agrees with plan. Discharged in good condition.   Meds given in ED:  Medications  acetaminophen (TYLENOL) tablet 650 mg (650 mg Oral Given 12/21/13 1748)  sodium chloride 0.9 % bolus 1,000 mL (0 mLs Intravenous Stopped 12/21/13 2102)  HYDROcodone-acetaminophen (NORCO/VICODIN) 5-325 MG per tablet 1 tablet (1 tablet Oral Given 12/21/13 2115)    Discharge Medication List as of 12/21/2013  7:48 PM    START taking these medications   Details  HYDROcodone-acetaminophen (NORCO/VICODIN) 5-325 MG per tablet Take 1 tablet by mouth every 4 (four) hours as needed., Starting 12/21/2013, Until Discontinued, Print    ondansetron (ZOFRAN ODT) 4 MG disintegrating tablet 4mg  ODT q4 hours prn nausea/vomit, Print           Rudene Anda, PA-C 12/22/13 [phone]

## 2013-12-21 NOTE — ED Provider Notes (Signed)
  Face-to-face evaluation   History: She complains of flank pain for 2 days. Pain waxes and wanes. She has had kidney stone 6 years ago. She denies fever, chills, nausea, vomiting  Physical exam: Exam, alert, calm, cooperative. Mild, right flank tenderness to light palpation. Abdomen include the uterus is soft and nontender.  Medical screening examination/treatment/procedure(s) were conducted as a shared visit with non-physician practitioner(s) and myself.  I personally evaluated the patient during the encounter  Flint Melter, MD 12/22/13 1753

## 2013-12-21 NOTE — Progress Notes (Signed)
   CARE MANAGEMENT ED NOTE 12/21/2013  Patient:  Lindsay Rollins, Lindsay Rollins   Account Number:  [id number]  Date Initiated:  12/21/2013  Documentation initiated by:  Radford Pax  Subjective/Objective Assessment:   Patient presents to Ed with flank pain for two days.     Subjective/Objective Assessment Detail:     Action/Plan:   Action/Plan Detail:   Anticipated DC Date:  12/21/2013     Status Recommendation to Physician:   Result of Recommendation:    Other ED Services  Consult Working Plan    DC Planning Services  Other  PCP issues    Choice offered to / List presented to:            Status of service:  Completed, signed off  ED Comments:   ED Comments Detail:  Patient confirms her pcp is Dr. Marina Goodell in [place]. System updated.

## 2013-12-21 NOTE — Progress Notes (Signed)
At bedside with pt who gets care in [place] for prenatal care with [facility]. Pt EDC 04/03/14 and is a G1P0. Pt reports positive fetal movement, no vaginal bleeding or leaking of fluid. Pt c/o of right side and back flank pain. Pt denies abd pain. Pt states started 2 nights ago. Pt denies complications with pregnancy. Pt states has had clear mucus discharge but no leaking.

## 2013-12-22 LAB — URINE CULTURE
Colony Count: NO GROWTH
Culture: NO GROWTH

## 2014-01-26 ENCOUNTER — Encounter (HOSPITAL_COMMUNITY): Payer: Self-pay | Admitting: Obstetrics and Gynecology

## 2014-01-26 ENCOUNTER — Other Ambulatory Visit (HOSPITAL_COMMUNITY): Payer: Self-pay | Admitting: Obstetrics and Gynecology

## 2014-01-26 DIAGNOSIS — O26849 Uterine size-date discrepancy, unspecified trimester: Secondary | ICD-10-CM

## 2014-02-02 ENCOUNTER — Ambulatory Visit (HOSPITAL_COMMUNITY): Payer: Managed Care, Other (non HMO)

## 2014-02-08 ENCOUNTER — Ambulatory Visit (HOSPITAL_COMMUNITY)
Admission: RE | Admit: 2014-02-08 | Discharge: 2014-02-08 | Disposition: A | Discharge status: Home / Self Care | Payer: Medicaid Other | Source: Ambulatory Visit | Attending: Obstetrics and Gynecology | Admitting: Obstetrics and Gynecology

## 2014-02-08 ENCOUNTER — Other Ambulatory Visit (HOSPITAL_COMMUNITY): Payer: Self-pay | Admitting: Obstetrics and Gynecology

## 2014-02-08 VITALS — BP 114/63 | HR 118 | Wt 112.0 lb

## 2014-02-08 DIAGNOSIS — O26849 Uterine size-date discrepancy, unspecified trimester: Secondary | ICD-10-CM

## 2014-02-08 DIAGNOSIS — O358XX0 Maternal care for other (suspected) fetal abnormality and damage, not applicable or unspecified: Secondary | ICD-10-CM | POA: Insufficient documentation

## 2014-02-08 DIAGNOSIS — Z1389 Encounter for screening for other disorder: Secondary | ICD-10-CM | POA: Insufficient documentation

## 2014-02-08 DIAGNOSIS — O365990 Maternal care for other known or suspected poor fetal growth, unspecified trimester, not applicable or unspecified: Secondary | ICD-10-CM | POA: Insufficient documentation

## 2014-02-08 DIAGNOSIS — Z363 Encounter for antenatal screening for malformations: Secondary | ICD-10-CM | POA: Insufficient documentation

## 2014-03-01 ENCOUNTER — Ambulatory Visit (HOSPITAL_COMMUNITY)
Admission: RE | Admit: 2014-03-01 | Payer: Managed Care, Other (non HMO) | Source: Ambulatory Visit | Attending: Legal Medicine | Admitting: Legal Medicine

## 2014-07-14 ENCOUNTER — Encounter (HOSPITAL_COMMUNITY): Payer: Self-pay | Admitting: *Deleted

## 2014-10-23 ENCOUNTER — Encounter (HOSPITAL_COMMUNITY): Payer: Self-pay | Admitting: *Deleted

## 2015-11-23 DIAGNOSIS — R590 Localized enlarged lymph nodes: Secondary | ICD-10-CM | POA: Diagnosis not present

## 2015-11-23 DIAGNOSIS — D72829 Elevated white blood cell count, unspecified: Secondary | ICD-10-CM | POA: Diagnosis not present

## 2015-11-28 IMAGING — US US RENAL
1 series · 14 of 25 positions shown · non-contrast
Comparison: None.

CLINICAL DATA: Right flank pain.  24 weeks pregnant.

EXAM:
RENAL/URINARY TRACT ULTRASOUND COMPLETE

[Series 1: us renal · 0.20mm/px · 14 of 38 slices shown]
[im 1/38]
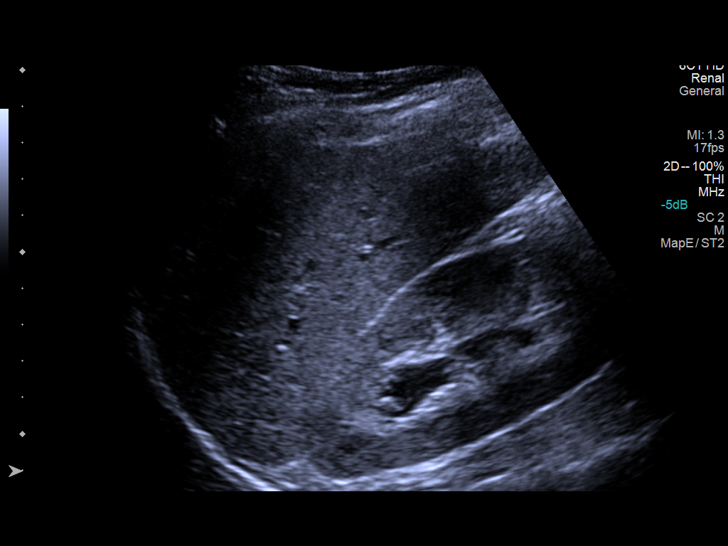
[im 4/38]
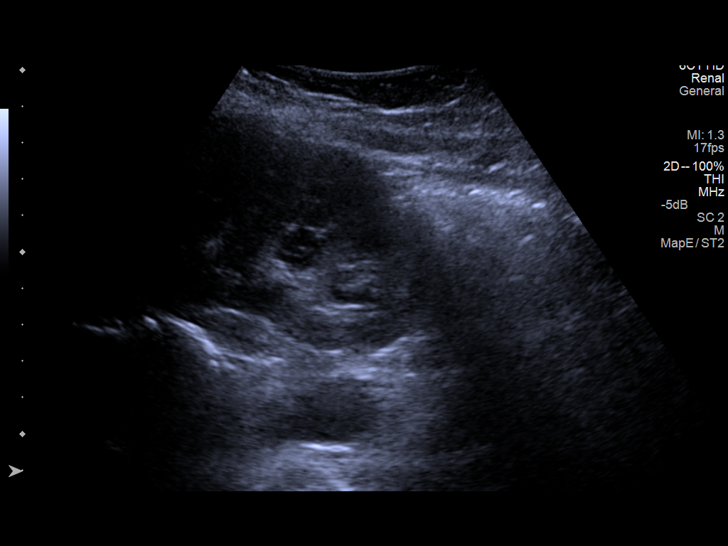
[im 7/38]
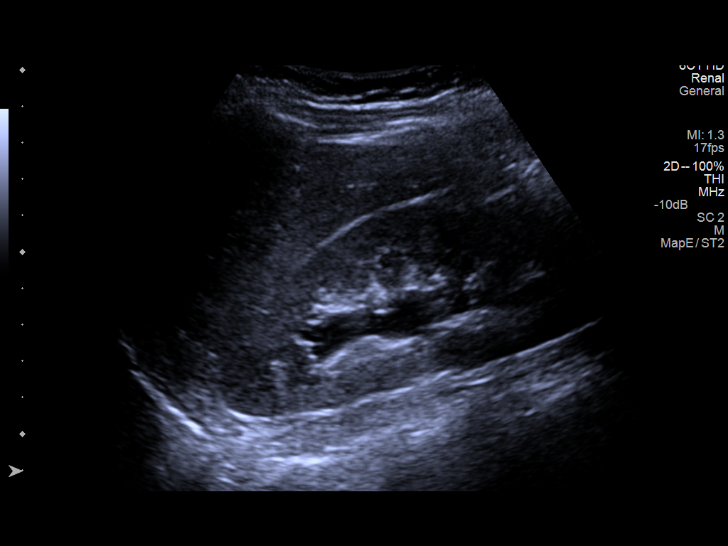
[im 10/38]
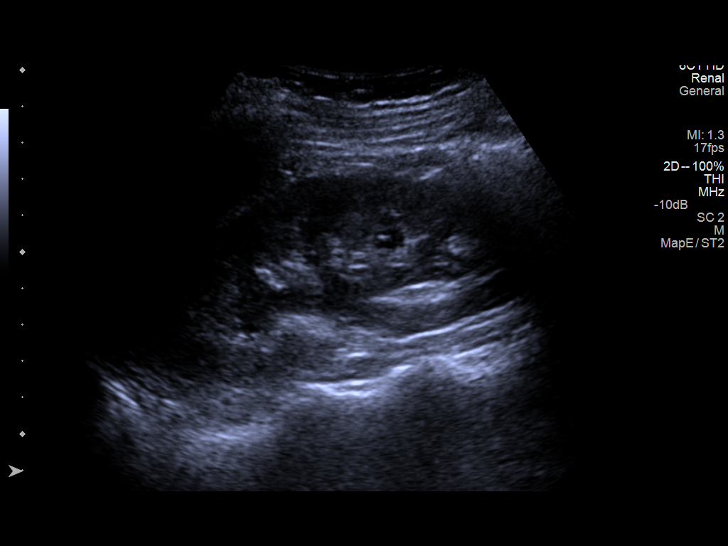
[im 13/38]
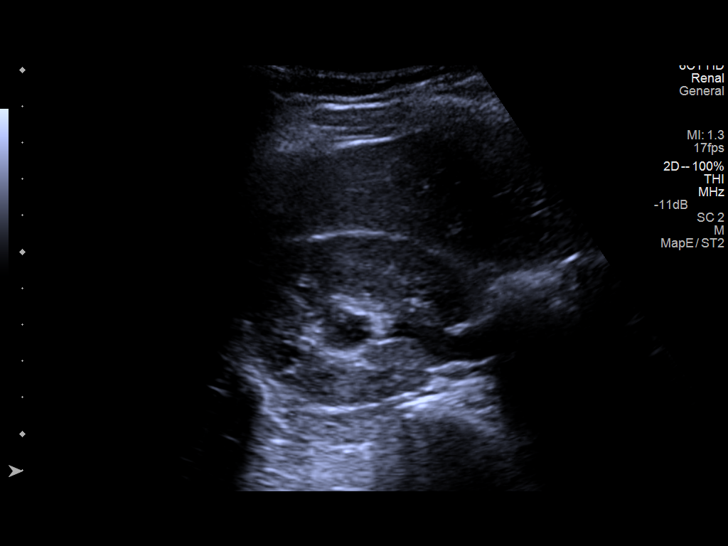
[im 14/38]
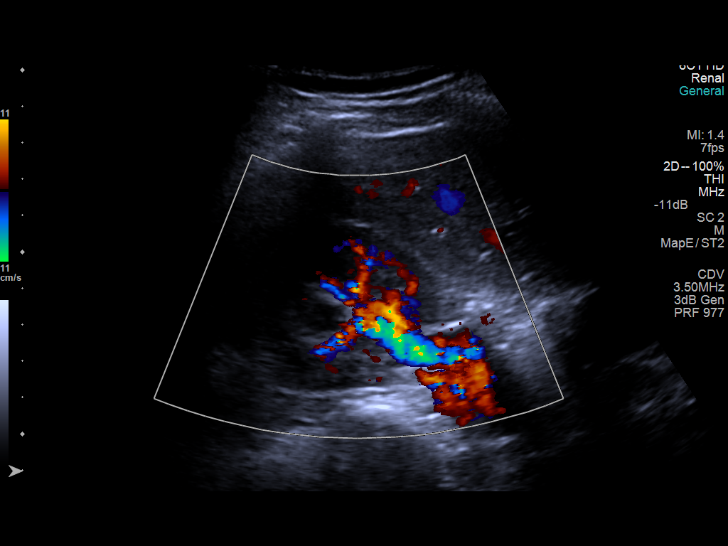
[im 17/38]
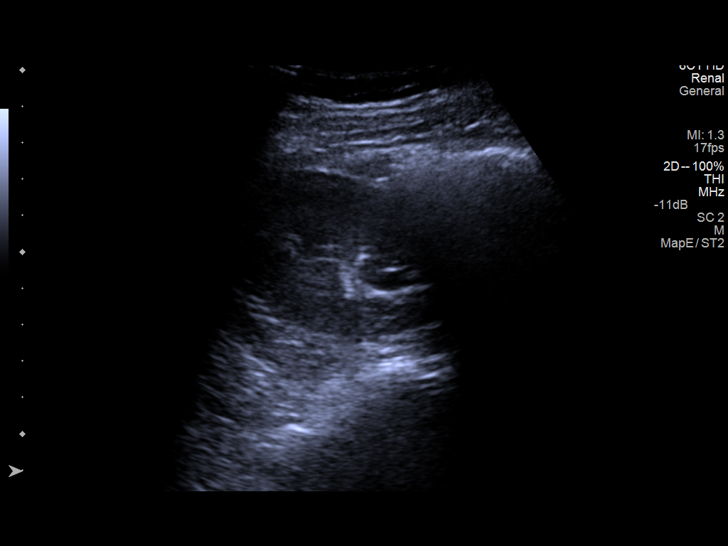
[im 21/38]
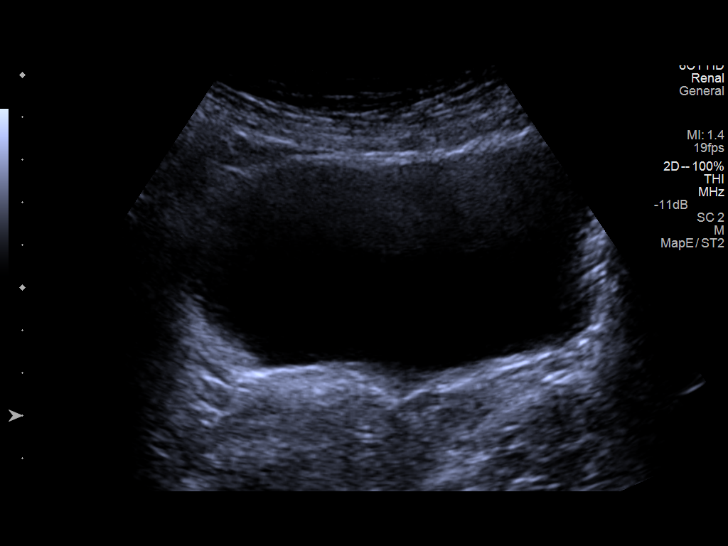
[im 24/38]
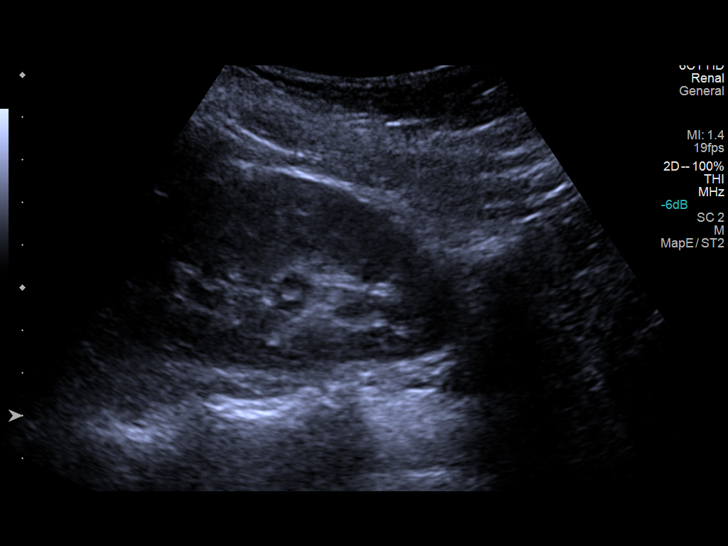
[im 25/38]
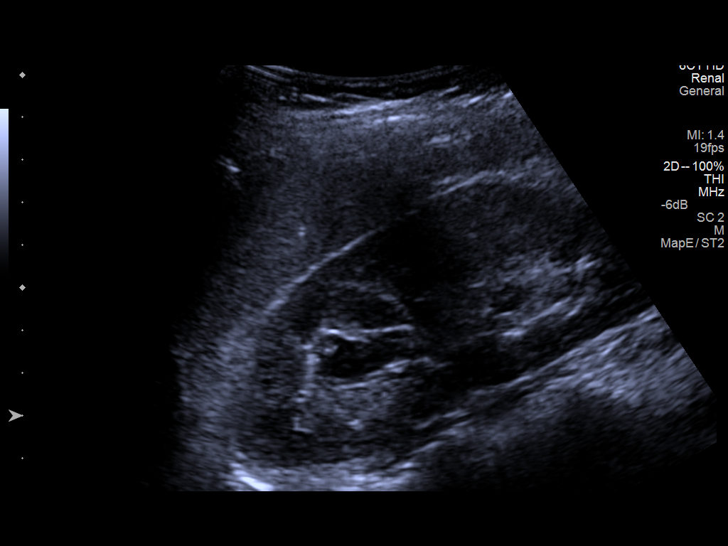
[im 28/38]
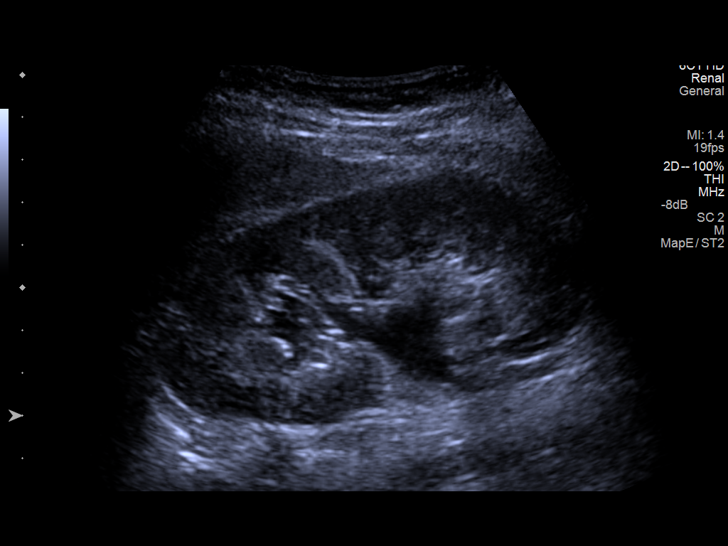
[im 31/38]
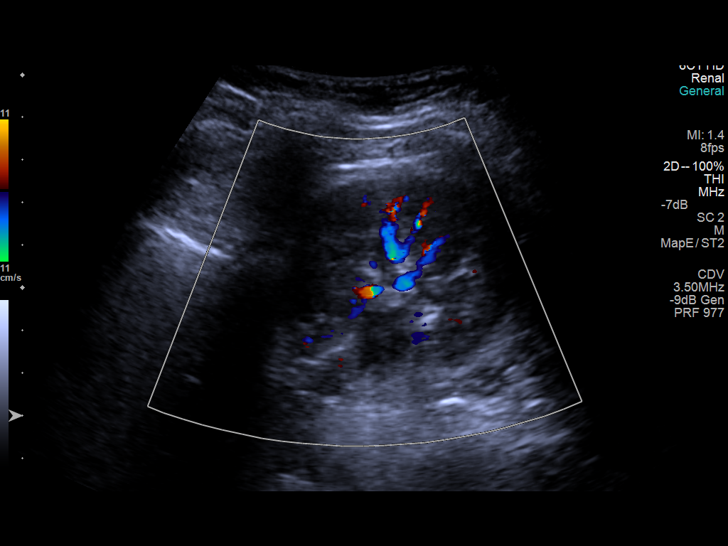
[im 34/38]
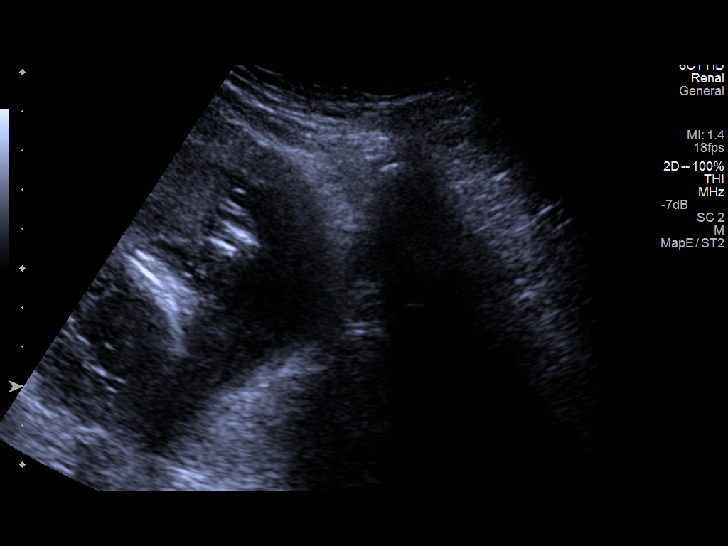
[im 38/38]
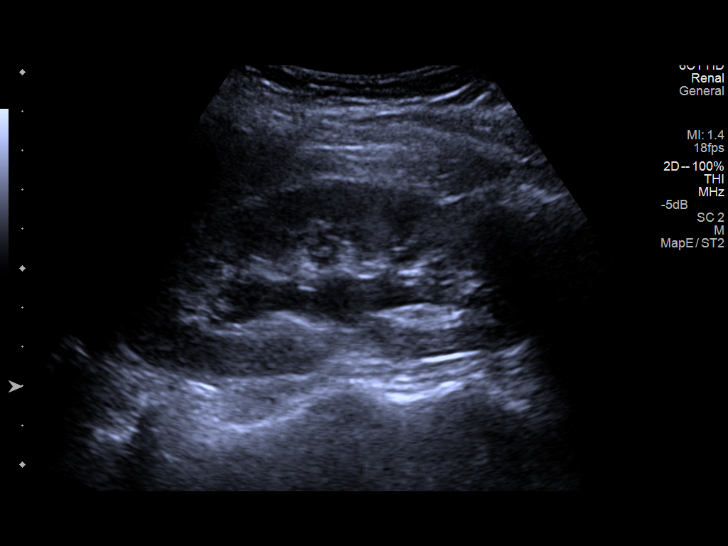

[14 of 25 positions shown; findings below may reference images not displayed]

FINDINGS: Right Kidney:

Length: 11.7 cm. Echogenicity within normal limits. No renal mass
identified. Mild hydronephrosis demonstrated.

Left Kidney:

Length: 10.9 cm. Echogenicity within normal limits. No renal mass
identified. Mild hydronephrosis demonstrated.

Bladder:

Appears normal for degree of bladder distention. Intrauterine fetus
noted.
IMPRESSION: Mild symmetric bilateral hydronephrosis.

## 2018-01-27 ENCOUNTER — Encounter: Payer: Self-pay | Admitting: Neurology

## 2018-02-05 ENCOUNTER — Ambulatory Visit: Payer: Commercial Managed Care - PPO | Admitting: Neurology

## 2018-02-05 ENCOUNTER — Encounter: Payer: Self-pay | Admitting: Neurology

## 2018-02-05 VITALS — BP 110/64 | HR 100 | Ht 59.0 in | Wt 102.2 lb

## 2018-02-05 DIAGNOSIS — G43109 Migraine with aura, not intractable, without status migrainosus: Secondary | ICD-10-CM | POA: Diagnosis not present

## 2018-02-05 DIAGNOSIS — R51 Headache: Secondary | ICD-10-CM

## 2018-02-05 DIAGNOSIS — M5412 Radiculopathy, cervical region: Secondary | ICD-10-CM | POA: Diagnosis not present

## 2018-02-05 DIAGNOSIS — G4486 Cervicogenic headache: Secondary | ICD-10-CM

## 2018-02-05 NOTE — Progress Notes (Signed)
Pt requested MRI at [facility]. Sent Esmeralda a message to check if PA is needed. Rcvd rtrn message, not precert needed for [phone], [facility]. Called [facility], spoke with Isela, appt 02/08/18, arrive 7:15 for 7:30. Called Pt, LM on VM

## 2018-02-05 NOTE — Patient Instructions (Signed)
I think the headaches are likely coming from the neck. I would like to check MRI of the cervical spine to evaluate for a specific cause (such as disc bulge pressing on a nerve)  Further recommendations pending results.  In the meantime, try the following vitamins/supplements:  Magnesium citrate 400mg  daily, riboflavin 400mg  daily and coenzyme Q10 100mg  three times daily  Limit use of pain relievers (such as Motrin) to no more than 2 days out of the week to prevent rebound headache

## 2018-02-05 NOTE — Progress Notes (Signed)
NEUROLOGY CONSULTATION NOTE  Lindsay Rollins MRN: [id number] DOB: 03/18/1991  Referring provider: Meta HatchetJennifer Brooks, PA Primary care provider: Brent BullaLawrence Perry, MD  Reason for consult:  Occipital headache  HISTORY OF PRESENT ILLNESS: Lindsay Rollins is a 27 year old female who presents for headache.  History supplemented by ENT note.  She began experiencing headaches about 2 years ago after she had her wisdom teeth removed.  She reports no prior history of headache.  She describes a severe holocephalic throbbing and pressure-like headache, lasting all day and occurring daily for 2 weeks every month.  Sometimes it may be associated with nausea.  She typically takes Motrin.  Nothing specifically aggravates or relieves it.   Around the same time, she will occasionally get a severe headache radiating from the back of the neck on the left and radiating up the left occipital region, into the left ear and to the top of the head.  Sometimes it is associated with seeing sparkling stars in the left side of her visual field.  It may last several minutes.  It typically occurs once a week.    A couple of years ago, she would occasionally note numbness and tingling in the left thumb and index finger.  Sometimes she reports numbness along the left side of her jaw/lower face.  She has associated neck and upper back pain but no radiating pain or weakness into the arm.  She reports photosensitivity and sensitivity to sound.  She denies unilateral weakness.  Sometimes she reports word-finding difficulty.  PAST MEDICAL HISTORY: Past Medical History:  Diagnosis Date  . Abnormal Pap smear   . Asthma   . Complication of anesthesia   . Ulcer     PAST SURGICAL HISTORY: Past Surgical History:  Procedure Laterality Date  . CERVICAL BIOPSY    . TONSILLECTOMY    . WISDOM TOOTH EXTRACTION      MEDICATIONS: Current Outpatient Medications on File Prior to Visit  Medication Sig Dispense Refill  .  acetaminophen (TYLENOL) 325 MG tablet Take 650 mg by mouth every 6 (six) hours as needed (pain).    Marland Kitchen. albuterol (PROVENTIL HFA;VENTOLIN HFA) 108 (90 BASE) MCG/ACT inhaler Inhale 2 puffs into the lungs every 6 (six) hours as needed for wheezing or shortness of breath.    . EPINEPHrine (EPI-PEN) 0.3 mg/0.3 mL DEVI Inject 0.3 mLs (0.3 mg total) into the muscle once. 2 Device 0  . HYDROcodone-acetaminophen (NORCO/VICODIN) 5-325 MG per tablet Take 1 tablet by mouth every 4 (four) hours as needed. 12 tablet 0  . ondansetron (ZOFRAN ODT) 4 MG disintegrating tablet 4mg  ODT q4 hours prn nausea/vomit 8 tablet 0  . Prenatal Vit-Fe Fumarate-FA (PRENATAL MULTIVITAMIN) TABS tablet Take 1 tablet by mouth daily at 12 noon.     No current facility-administered medications on file prior to visit.     ALLERGIES: Allergies  Allergen Reactions  . Shellfish Allergy Anaphylaxis  . Amoxicillin Hives  . Cefdinir Hives    Patient develops hives in the mouth.  . Penicillins Hives  . Clindamycin/Lincomycin Palpitations    FAMILY HISTORY: Family History  Problem Relation Age of Onset  . Hyperthyroidism Mother   . High blood pressure Mother   . Diabetes Father   . Stroke Father   . High blood pressure Father   . Transient ischemic attack Maternal Grandmother   . Heart disease Maternal Grandmother   . Prostate cancer Maternal Grandfather   . Diabetes Paternal Grandmother   . Parkinson's disease Paternal  Grandfather     SOCIAL HISTORY: Social History   Socioeconomic History  . Marital status: Married    Spouse name: Addison  . Number of children: 1  . Years of education: Not on file  . Highest education level: Bachelor's degree (e.g., BA, AB, BS)  Social Needs  . Financial resource strain: Not on file  . Food insecurity - worry: Not on file  . Food insecurity - inability: Not on file  . Transportation needs - medical: Not on file  . Transportation needs - non-medical: Not on file  Occupational  History  . Occupation: Teacher, adult education: [employer]  Tobacco Use  . Smoking status: Former Smoker    Types: Cigarettes    Last attempt to quit: 07/17/2013    Years since quitting: 4.5  Substance and Sexual Activity  . Alcohol use: No  . Drug use: No  . Sexual activity: Yes    Birth control/protection: None  Other Topics Concern  . Not on file  Social History Narrative   Pt is left handed. She is married, lives with her husband and 1 daughter in a 1 level house. She drinks one cup of coffee a day, and one tea or soda. She exercises 3 x week for 1-1.5 hrs.    REVIEW OF SYSTEMS: Constitutional: No fevers, chills, or sweats, no generalized fatigue, change in appetite Eyes: No visual changes, double vision, eye pain Ear, nose and throat: No hearing loss, ear pain, nasal congestion, sore throat Cardiovascular: No chest pain, palpitations Respiratory:  No shortness of breath at rest or with exertion, wheezes GastrointestinaI: No nausea, vomiting, diarrhea, abdominal pain, fecal incontinence Genitourinary:  No dysuria, urinary retention or frequency Musculoskeletal:  No neck pain, back pain Integumentary: No rash, pruritus, skin lesions Neurological: as above Psychiatric: No depression, insomnia, anxiety Endocrine: No palpitations, fatigue, diaphoresis, mood swings, change in appetite, change in weight, increased thirst Hematologic/Lymphatic:  No purpura, petechiae. Allergic/Immunologic: no itchy/runny eyes, nasal congestion, recent allergic reactions, rashes  PHYSICAL EXAM: Vitals:   02/05/18 1251  BP: 110/64  Pulse: 100  SpO2: 98%   General: No acute distress.  Patient appears well-groomed.  Head:  Normocephalic/atraumatic.  No jaw claudication. Eyes:  fundi examined but not visualized Neck: supple, mild left sided suboccipital and paraspinal tenderness, full range of motion Back: No paraspinal tenderness Heart: regular rate and rhythm Lungs: Clear to auscultation  bilaterally. Vascular: No carotid bruits. Neurological Exam: Mental status: alert and oriented to person, place, and time, recent and remote memory intact, fund of knowledge intact, attention and concentration intact, speech fluent and not dysarthric, language intact. Cranial nerves: CN I: not tested CN II: pupils equal, round and reactive to light, visual fields intact CN III, IV, VI:  full range of motion, no nystagmus, no ptosis CN V: facial sensation intact CN VII: upper and lower face symmetric CN VIII: hearing intact CN IX, X: gag intact, uvula midline CN XI: sternocleidomastoid and trapezius muscles intact CN XII: tongue midline Bulk & Tone: normal, no fasciculations. Motor:  5/5 throughout  Sensation: temperature and vibration sensation intact. Deep Tendon Reflexes:  2+ throughout, toes downgoing.  Finger to nose testing:  Without dysmetria.  Heel to shin:  Without dysmetria.   Gait:  Normal station and stride.  Able to turn and tandem walk. Romberg negative.  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  At this time, I don't suspect an intracranial etiology.  Her neurologic exam is unremarkable.  She reports some  word-finding difficulty but I don't appreciate any cognitive impairment.  PLAN: 1.  We will start with MRI of cervical spine and go from there.   2.  She would rather not start a preventative medication.  She will try magnesium, riboflavin and CoQ10. 3. Advised to limit pain relievers to no more than 2 days out of week to prevent rebound headache. 4.  Follow up in 3 months  Thank you for allowing me to take part in the care of this patient.  45 minutes spent face to face with patient, over 50% spent discussing diagnosis and management.  Shon Millet, DO  CC:  Brent Bulla, MD  Meta Hatchet, [place]

## 2018-02-09 NOTE — Progress Notes (Signed)
Rcvd fax from [facility] MRI Department. Pt no showed her imaging study. Called Pt, LM on VM [facility] to [phone]
